# Patient Record
Sex: Male | Born: 1985
Health system: Southern US, Community
[De-identification: ages and names within clinical notes are randomized; demographics above are authoritative.]

## PROBLEM LIST (undated history)

## (undated) DIAGNOSIS — G809 Cerebral palsy, unspecified: Secondary | ICD-10-CM

## (undated) DIAGNOSIS — R569 Unspecified convulsions: Secondary | ICD-10-CM

## (undated) HISTORY — PX: PATENT DUCTUS ARTERIOUS REPAIR: SHX269

## (undated) HISTORY — PX: EYE SURGERY: SHX253

## (undated) HISTORY — PX: OTHER SURGICAL HISTORY: SHX169

---

## 2011-11-06 ENCOUNTER — Encounter (HOSPITAL_BASED_OUTPATIENT_CLINIC_OR_DEPARTMENT_OTHER): Payer: Self-pay | Admitting: *Deleted

## 2011-11-06 ENCOUNTER — Emergency Department (HOSPITAL_BASED_OUTPATIENT_CLINIC_OR_DEPARTMENT_OTHER)
Admission: EM | Admit: 2011-11-06 | Discharge: 2011-11-06 | Disposition: A | Discharge status: Home / Self Care | Payer: Medicaid Other | Attending: Emergency Medicine | Admitting: Emergency Medicine

## 2011-11-06 DIAGNOSIS — G809 Cerebral palsy, unspecified: Secondary | ICD-10-CM | POA: Insufficient documentation

## 2011-11-06 DIAGNOSIS — J029 Acute pharyngitis, unspecified: Secondary | ICD-10-CM | POA: Insufficient documentation

## 2011-11-06 DIAGNOSIS — G40909 Epilepsy, unspecified, not intractable, without status epilepticus: Secondary | ICD-10-CM | POA: Insufficient documentation

## 2011-11-06 HISTORY — DX: Unspecified convulsions: R56.9

## 2011-11-06 HISTORY — DX: Cerebral palsy, unspecified: G80.9

## 2011-11-06 LAB — RAPID STREP SCREEN (MED CTR MEBANE ONLY): Streptococcus, Group A Screen (Direct): NEGATIVE

## 2011-11-06 MED ORDER — LIDOCAINE VISCOUS 2 % MT SOLN: 15.0000 mL | OROMUCOSAL | Status: AC | PRN

## 2011-11-06 NOTE — ED Provider Notes (Signed)
History     CSN: [id number]  Arrival date & time 11/06/11  1721   First MD Initiated Contact with Patient 11/06/11 1851      Chief Complaint  Patient presents with  . Sore Throat    (Consider location/radiation/quality/duration/timing/severity/associated sxs/prior treatment) HPI  27 year old man with cerebral palsy accompanied by mother complaining of sore throat worsening over the course of the week. Patient denies any cough reports subjective fever, denies any nausea vomiting abdominal pain. Patient has reduced by mouth intake secondary to pain. He has tried no pain medications.  Past Medical History  Diagnosis Date  . Seizures   . CP (cerebral palsy)     Past Surgical History  Procedure Date  . Patent ductus arterious repair   . Cerebral shunt   . Eye surgery     No family history on file.  History  Substance Use Topics  . Smoking status: Not on file  . Smokeless tobacco: Not on file  . Alcohol Use:       Review of Systems  Constitutional: Positive for fever.  HENT: Positive for sore throat.   Respiratory: Negative for shortness of breath.   Cardiovascular: Negative for chest pain.  Gastrointestinal: Negative for nausea, vomiting, abdominal pain and diarrhea.  All other systems reviewed and are negative.    Allergies  Review of patient's allergies indicates no known allergies.  Home Medications   Current Outpatient Rx  Name Route Sig Dispense Refill  . DIVALPROEX SODIUM 500 MG PO TBEC Oral Take 500 mg by mouth 3 (three) times daily.    Marland Kitchen LIDOCAINE VISCOUS 2 % MT SOLN Oral Take 15 mLs by mouth every 3 (three) hours as needed for pain. 100 mL 0    BP 115/80  Pulse 59  Temp 98.3 F (36.8 C) (Oral)  Resp 22  SpO2 100%  Physical Exam  Nursing note and vitals reviewed. Constitutional: He is oriented to person, place, and time. He appears well-developed and well-nourished. No distress.  HENT:  Head: Normocephalic and atraumatic.  Right Ear:  External ear normal.  Left Ear: External ear normal.  Nose: Nose normal.  Mouth/Throat: Oropharynx is clear and moist. No oropharyngeal exudate.       No tonsillar hypertrophy. Mild posterior pharynx erythema.  Eyes: Conjunctivae normal and EOM are normal. Pupils are equal, round, and reactive to light.  Neck: Normal range of motion.  Cardiovascular: Normal rate, regular rhythm and normal heart sounds.   Pulmonary/Chest: Effort normal and breath sounds normal. No stridor. No respiratory distress. He has no wheezes. He has no rales. He exhibits no tenderness.  Abdominal: Soft. Bowel sounds are normal.  Musculoskeletal: Normal range of motion.  Neurological: He is alert and oriented to person, place, and time.  Skin: Skin is warm.  Psychiatric: He has a normal mood and affect.    ED Course  Procedures (including critical care time)   Labs Reviewed  RAPID STREP SCREEN  STREP A DNA PROBE   No results found.   1. Pharyngitis       MDM  26 year old male with sore throat worsening over the course of 4 days. He does not meet center criteria for treatment. I will send off a strep DNA probe and treat symptomatically with lidocaine.  New Prescriptions   LIDOCAINE (XYLOCAINE) 2 % SOLUTION    Take 15 mLs by mouth every 3 (three) hours as needed for pain.           Wynetta Emery, PA-C 11/06/11  1914 

## 2011-11-06 NOTE — ED Notes (Signed)
Sore throat x 3 days

## 2011-11-07 ENCOUNTER — Emergency Department (HOSPITAL_BASED_OUTPATIENT_CLINIC_OR_DEPARTMENT_OTHER)
Admission: EM | Admit: 2011-11-07 | Discharge: 2011-11-07 | Disposition: A | Discharge status: Home / Self Care | Payer: Medicaid Other | Attending: Emergency Medicine | Admitting: Emergency Medicine

## 2011-11-07 ENCOUNTER — Encounter (HOSPITAL_BASED_OUTPATIENT_CLINIC_OR_DEPARTMENT_OTHER): Payer: Self-pay | Admitting: *Deleted

## 2011-11-07 DIAGNOSIS — G40909 Epilepsy, unspecified, not intractable, without status epilepticus: Secondary | ICD-10-CM | POA: Insufficient documentation

## 2011-11-07 DIAGNOSIS — K0889 Other specified disorders of teeth and supporting structures: Secondary | ICD-10-CM

## 2011-11-07 DIAGNOSIS — G809 Cerebral palsy, unspecified: Secondary | ICD-10-CM | POA: Insufficient documentation

## 2011-11-07 DIAGNOSIS — K089 Disorder of teeth and supporting structures, unspecified: Secondary | ICD-10-CM | POA: Insufficient documentation

## 2011-11-07 LAB — STREP A DNA PROBE: Group A Strep Probe: NEGATIVE

## 2011-11-07 MED ORDER — PENICILLIN V POTASSIUM 500 MG PO TABS: 500.0000 mg | ORAL_TABLET | Freq: Three times a day (TID) | ORAL | Status: DC

## 2011-11-07 MED ORDER — NAPROXEN 500 MG PO TABS: 500.0000 mg | ORAL_TABLET | Freq: Two times a day (BID) | ORAL | Status: AC

## 2011-11-07 NOTE — ED Provider Notes (Signed)
Medical screening examination/treatment/procedure(s) were performed by non-physician practitioner and as supervising physician I was immediately available for consultation/collaboration.  Ah Bott M Sumedha Munnerlyn, MD 11/07/11 1556 

## 2011-11-07 NOTE — ED Notes (Signed)
Right facial pain. Mom thinks he has a tooth abscess. Was here yesterday for sore throat.

## 2011-11-07 NOTE — ED Provider Notes (Signed)
History     CSN: [id number] Arrival date & time 11/07/11  1750 First MD Initiated Contact with Patient 11/07/11 1815      Chief Complaint  Patient presents with  . Dental Pain    HPI The patient presents to the emergency room with complaints of dental pain. Patient has history of cerebral palsy. Mom thought he had been having trouble with a sore throat. He was seen in the emergency department yesterday and had negative strep test.  Mom states that today his symptoms persisted and he seemed to be complaining of pain more around his tooth area on the right lower side. He has not seen a dentist in a while. She brought him to the emergency room for evaluation. He has not had any trouble with fever, vomiting or diarrhea.  No difficulty swallowing Past Medical History  Diagnosis Date  . Seizures   . CP (cerebral palsy)     Past Surgical History  Procedure Date  . Patent ductus arterious repair   . Cerebral shunt   . Eye surgery     No family history on file.  History  Substance Use Topics  . Smoking status: Never Smoker   . Smokeless tobacco: Not on file  . Alcohol Use: No      Review of Systems  All other systems reviewed and are negative.    Allergies  Review of patient's allergies indicates no known allergies.  Home Medications   Current Outpatient Rx  Name Route Sig Dispense Refill  . DIVALPROEX SODIUM 500 MG PO TBEC Oral Take 500 mg by mouth 3 (three) times daily.    Marland Kitchen LIDOCAINE VISCOUS 2 % MT SOLN Oral Take 15 mLs by mouth every 3 (three) hours as needed for pain. 100 mL 0  . NAPROXEN 500 MG PO TABS Oral Take 1 tablet (500 mg total) by mouth 2 (two) times daily. 30 tablet 0  . PENICILLIN V POTASSIUM 500 MG PO TABS Oral Take 1 tablet (500 mg total) by mouth 3 (three) times daily. 30 tablet 0    BP 119/84  Pulse 54  Temp 98 F (36.7 C) (Oral)  Resp 20  SpO2 100%  Physical Exam  Nursing note and vitals reviewed. Constitutional: He appears well-developed  and well-nourished. No distress.  HENT:  Head: Normocephalic and atraumatic.  Right Ear: External ear normal.  Left Ear: External ear normal.  Mouth/Throat: No oropharyngeal exudate.       No obvious dental abnormalities or swelling,  excessive plaque buildup or irritation  Eyes: Conjunctivae normal are normal. Right eye exhibits no discharge. Left eye exhibits no discharge. No scleral icterus.  Neck: Neck supple. No tracheal deviation present.  Cardiovascular: Normal rate, regular rhythm and intact distal pulses.   Pulmonary/Chest: Effort normal and breath sounds normal. No stridor. No respiratory distress. He has no wheezes. He exhibits no tenderness.  Abdominal: He exhibits no distension.  Musculoskeletal: He exhibits no edema and no tenderness.  Neurological: He is alert. He has normal strength. No sensory deficit. Cranial nerve deficit:  no gross defecits noted. He displays no seizure activity.  Skin: Skin is warm and dry. No rash noted.  Psychiatric: He has a normal mood and affect.    ED Course  Procedures (including critical care time)  Labs Reviewed - No data to display No results found.   1. Pain, dental       MDM  Is possible the patient is developing a dental infection. No obvious abscess noted.  I will start him empirically on penicillin. Encourage followup with a dentist for further evaluation       Celene Kras, MD 11/07/11 [phone]

## 2013-01-27 ENCOUNTER — Encounter (HOSPITAL_BASED_OUTPATIENT_CLINIC_OR_DEPARTMENT_OTHER): Payer: Self-pay | Admitting: Emergency Medicine

## 2013-01-27 ENCOUNTER — Emergency Department (HOSPITAL_BASED_OUTPATIENT_CLINIC_OR_DEPARTMENT_OTHER)
Admission: EM | Admit: 2013-01-27 | Discharge: 2013-01-27 | Disposition: A | Discharge status: Home / Self Care | Payer: Medicaid Other | Attending: Emergency Medicine | Admitting: Emergency Medicine

## 2013-01-27 DIAGNOSIS — J329 Chronic sinusitis, unspecified: Secondary | ICD-10-CM

## 2013-01-27 DIAGNOSIS — IMO0002 Reserved for concepts with insufficient information to code with codable children: Secondary | ICD-10-CM | POA: Insufficient documentation

## 2013-01-27 DIAGNOSIS — Z792 Long term (current) use of antibiotics: Secondary | ICD-10-CM | POA: Insufficient documentation

## 2013-01-27 DIAGNOSIS — J069 Acute upper respiratory infection, unspecified: Secondary | ICD-10-CM | POA: Insufficient documentation

## 2013-01-27 DIAGNOSIS — Z791 Long term (current) use of non-steroidal anti-inflammatories (NSAID): Secondary | ICD-10-CM | POA: Insufficient documentation

## 2013-01-27 DIAGNOSIS — J029 Acute pharyngitis, unspecified: Secondary | ICD-10-CM | POA: Insufficient documentation

## 2013-01-27 DIAGNOSIS — Z9981 Dependence on supplemental oxygen: Secondary | ICD-10-CM | POA: Insufficient documentation

## 2013-01-27 DIAGNOSIS — Z79899 Other long term (current) drug therapy: Secondary | ICD-10-CM | POA: Insufficient documentation

## 2013-01-27 DIAGNOSIS — G40909 Epilepsy, unspecified, not intractable, without status epilepticus: Secondary | ICD-10-CM | POA: Insufficient documentation

## 2013-01-27 MED ORDER — FLUTICASONE PROPIONATE 50 MCG/ACT NA SUSP: 2.0000 | Freq: Every day | NASAL | Status: AC

## 2013-01-27 MED ORDER — IBUPROFEN 800 MG PO TABS: 800.0000 mg | ORAL_TABLET | Freq: Three times a day (TID) | ORAL | Status: AC

## 2013-01-27 MED ORDER — IBUPROFEN 800 MG PO TABS
800.0000 mg | ORAL_TABLET | Freq: Once | ORAL | Status: AC
  Administered 2013-01-27: 800 mg via ORAL
  Filled 2013-01-27: qty 1

## 2013-01-27 NOTE — ED Provider Notes (Signed)
CSN: [id number]     Arrival date & time 01/27/13  1758 History   First MD Initiated Contact with Patient 01/27/13 1800     Chief Complaint  Patient presents with  . Headache   (Consider location/radiation/quality/duration/timing/severity/associated sxs/prior Treatment) HPI Comments: Patient is a 27 year old male with a past medical history of seizures and cerebral palsy who presents to the emergency department with his mother complaining of sore throat, runny nose, congestion and headache x2 days. Denies fever, chills, abdominal pain, nausea, vomiting, cough, chest pain, shortness of breath or wheezing. No aggravating or alleviating factors. He has not tried any over-the-counter medications for his symptoms. No sick contacts.  Patient is a 27 y.o. male presenting with headaches. The history is provided by the patient and a relative.  Headache Associated symptoms: congestion and sore throat     Past Medical History  Diagnosis Date  . Seizures   . CP (cerebral palsy)    Past Surgical History  Procedure Laterality Date  . Patent ductus arterious repair    . Cerebral shunt    . Eye surgery     No family history on file. History  Substance Use Topics  . Smoking status: Never Smoker   . Smokeless tobacco: Not on file  . Alcohol Use: No    Review of Systems  HENT: Positive for congestion, rhinorrhea and sore throat.   Neurological: Positive for headaches.  All other systems reviewed and are negative.    Allergies  Review of patient's allergies indicates no known allergies.  Home Medications   Current Outpatient Rx  Name  Route  Sig  Dispense  Refill  . divalproex (DEPAKOTE) 500 MG DR tablet   Oral   Take 500 mg by mouth 3 (three) times daily.         . fluticasone (FLONASE) 50 MCG/ACT nasal spray   Each Nare   Place 2 sprays into both nostrils daily.   16 g   2   . ibuprofen (ADVIL,MOTRIN) 800 MG tablet   Oral   Take 1 tablet (800 mg total) by mouth 3 (three)  times daily.   21 tablet   0   . lidocaine (XYLOCAINE) 2 % solution   Oral   Take 15 mLs by mouth every 3 (three) hours as needed for pain.   100 mL   0   . naproxen (NAPROSYN) 500 MG tablet   Oral   Take 1 tablet (500 mg total) by mouth 2 (two) times daily.   30 tablet   0   . penicillin v potassium (VEETID) 500 MG tablet   Oral   Take 1 tablet (500 mg total) by mouth 3 (three) times daily.   30 tablet   0    BP 118/76  Pulse 66  Temp(Src) 97.6 F (36.4 C) (Oral)  Resp 20  Ht 4\' 11"  (1.499 m)  Wt 123 lb (55.792 kg)  BMI 24.83 kg/m2  SpO2 100% Physical Exam  Nursing note and vitals reviewed. Constitutional: He is oriented to person, place, and time. He appears well-developed and well-nourished. No distress.  HENT:  Head: Normocephalic and atraumatic.  Nose: Mucosal edema present. Right sinus exhibits frontal sinus tenderness. Left sinus exhibits frontal sinus tenderness.  Mouth/Throat: Uvula is midline, oropharynx is clear and moist and mucous membranes are normal.  Nasal congestion and post nasal drip.  Eyes: Conjunctivae are normal.  Neck: Normal range of motion. Neck supple.  Cardiovascular: Normal rate, regular rhythm and normal heart sounds.  Pulmonary/Chest: Effort normal and breath sounds normal.  Musculoskeletal: Normal range of motion. He exhibits no edema.  Neurological: He is alert and oriented to person, place, and time.  Skin: Skin is warm and dry. He is not diaphoretic.  Psychiatric: He has a normal mood and affect. His behavior is normal.    ED Course  Procedures (including critical care time) Labs Review Labs Reviewed - No data to display Imaging Review No results found.  EKG Interpretation   None       MDM   1. URI (upper respiratory infection)   2. Sinusitis    Well appearing, NAD, normal VS. Symptomatic tx discussed. Flonase, ibuprofen, mucinex, nasal saline. Return precautions given. Patient and mom state understanding of  treatment care plan and are agreeable.    Trevor Mace, PA-C 01/27/13 1821

## 2013-01-27 NOTE — ED Notes (Signed)
Headache, sore throat and runny nose x 2 days.

## 2013-01-27 NOTE — ED Provider Notes (Signed)
Medical screening examination/treatment/procedure(s) were performed by non-physician practitioner and as supervising physician I was immediately available for consultation/collaboration.  EKG Interpretation   None        Juliet Rude. Rubin Payor, MD 01/27/13 2255

## 2013-01-31 ENCOUNTER — Encounter: Payer: Self-pay | Admitting: Family Medicine

## 2013-01-31 ENCOUNTER — Ambulatory Visit (INDEPENDENT_AMBULATORY_CARE_PROVIDER_SITE_OTHER): Payer: Self-pay | Admitting: Family Medicine

## 2013-01-31 VITALS — BP 122/77 | HR 60 | Ht 59.0 in

## 2013-01-31 DIAGNOSIS — S8991XA Unspecified injury of right lower leg, initial encounter: Secondary | ICD-10-CM

## 2013-01-31 DIAGNOSIS — S8990XA Unspecified injury of unspecified lower leg, initial encounter: Secondary | ICD-10-CM

## 2013-01-31 NOTE — Patient Instructions (Signed)
You have a knee contusion. We will obtain records from [facility] (x-ray reports, physician report) as well and call you if anything looked abnormal. Ibuprofen or aleve as needed for pain. Icing 15 minutes at a time 3-4 times a day. Knee brace for support. If still struggling about 2 weeks from now call me and we can put you in physical therapy. Activities as tolerated. Follow up with me in 6 weeks for reevaluation.

## 2013-02-05 ENCOUNTER — Encounter: Payer: Self-pay | Admitting: Family Medicine

## 2013-02-05 DIAGNOSIS — S8991XA Unspecified injury of right lower leg, initial encounter: Secondary | ICD-10-CM | POA: Insufficient documentation
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
## 2013-02-05 NOTE — Assessment & Plan Note (Signed)
radiographs reportedly normal but will obtain reports from [facility].  Reassured, consistent with contusion.  Icing, knee brace, nsaids.  Consider formal PT if not improving.  F/u in 6 weeks for reevaluation.

## 2013-03-14 ENCOUNTER — Ambulatory Visit: Payer: Medicaid Other | Admitting: Family Medicine

## 2013-03-18 ENCOUNTER — Emergency Department (HOSPITAL_BASED_OUTPATIENT_CLINIC_OR_DEPARTMENT_OTHER): Payer: Medicaid Other

## 2013-03-18 ENCOUNTER — Emergency Department (HOSPITAL_BASED_OUTPATIENT_CLINIC_OR_DEPARTMENT_OTHER)
Admission: EM | Admit: 2013-03-18 | Discharge: 2013-03-18 | Disposition: A | Discharge status: Home / Self Care | Payer: Medicaid Other | Attending: Emergency Medicine | Admitting: Emergency Medicine

## 2013-03-18 ENCOUNTER — Encounter (HOSPITAL_BASED_OUTPATIENT_CLINIC_OR_DEPARTMENT_OTHER): Payer: Self-pay | Admitting: Emergency Medicine

## 2013-03-18 DIAGNOSIS — G40909 Epilepsy, unspecified, not intractable, without status epilepticus: Secondary | ICD-10-CM | POA: Insufficient documentation

## 2013-03-18 DIAGNOSIS — J111 Influenza due to unidentified influenza virus with other respiratory manifestations: Secondary | ICD-10-CM | POA: Insufficient documentation

## 2013-03-18 DIAGNOSIS — Z791 Long term (current) use of non-steroidal anti-inflammatories (NSAID): Secondary | ICD-10-CM | POA: Insufficient documentation

## 2013-03-18 DIAGNOSIS — Z79899 Other long term (current) drug therapy: Secondary | ICD-10-CM | POA: Insufficient documentation

## 2013-03-18 DIAGNOSIS — Z982 Presence of cerebrospinal fluid drainage device: Secondary | ICD-10-CM | POA: Insufficient documentation

## 2013-03-18 DIAGNOSIS — R69 Illness, unspecified: Secondary | ICD-10-CM

## 2013-03-18 LAB — CBC WITH DIFFERENTIAL/PLATELET
BAND NEUTROPHILS: 0 % (ref 0–10)
Basophils Absolute: 0 10*3/uL (ref 0.0–0.1)
Basophils Relative: 1 % (ref 0–1)
Blasts: 0 %
EOS ABS: 0 10*3/uL (ref 0.0–0.7)
EOS PCT: 1 % (ref 0–5)
HEMATOCRIT: 43.5 % (ref 39.0–52.0)
HEMOGLOBIN: 14.7 g/dL (ref 13.0–17.0)
LYMPHS ABS: 2.8 10*3/uL (ref 0.7–4.0)
Lymphocytes Relative: 63 % — ABNORMAL HIGH (ref 12–46)
MCH: 27.6 pg (ref 26.0–34.0)
MCHC: 33.8 g/dL (ref 30.0–36.0)
MCV: 81.6 fL (ref 78.0–100.0)
METAMYELOCYTES PCT: 0 %
MONO ABS: 0.5 10*3/uL (ref 0.1–1.0)
MONOS PCT: 11 % (ref 3–12)
Myelocytes: 0 %
NRBC: 0 /100{WBCs}
Neutro Abs: 1 10*3/uL — ABNORMAL LOW (ref 1.7–7.7)
Neutrophils Relative %: 24 % — ABNORMAL LOW (ref 43–77)
Platelets: DECREASED 10*3/uL (ref 150–400)
Promyelocytes Absolute: 0 %
RBC: 5.33 MIL/uL (ref 4.22–5.81)
RDW: 14.4 % (ref 11.5–15.5)
WBC: 4.3 10*3/uL (ref 4.0–10.5)

## 2013-03-18 LAB — BASIC METABOLIC PANEL
BUN: 17 mg/dL (ref 6–23)
CALCIUM: 9.4 mg/dL (ref 8.4–10.5)
CO2: 30 mEq/L (ref 19–32)
CREATININE: 1 mg/dL (ref 0.50–1.35)
Chloride: 99 mEq/L (ref 96–112)
GFR calc Af Amer: >90 mL/min (ref >90)
GLUCOSE: 136 mg/dL — AB (ref 70–99)
Potassium: 4.2 mEq/L (ref 3.7–5.3)
SODIUM: 141 meq/L (ref 137–147)

## 2013-03-18 LAB — CG4 I-STAT (LACTIC ACID): Lactic Acid, Venous: 1.75 mmol/L (ref 0.5–2.2)

## 2013-03-18 MED ORDER — IBUPROFEN 600 MG PO TABS: 600.0000 mg | ORAL_TABLET | Freq: Four times a day (QID) | ORAL | Status: AC | PRN

## 2013-03-18 MED ORDER — ACETAMINOPHEN 325 MG PO TABS
650.0000 mg | ORAL_TABLET | Freq: Once | ORAL | Status: AC
  Administered 2013-03-18: 650 mg via ORAL
  Filled 2013-03-18: qty 2

## 2013-03-18 MED ORDER — SODIUM CHLORIDE 0.9 % IV BOLUS (SEPSIS): 500.0000 mL | Freq: Once | INTRAVENOUS | Status: DC

## 2013-03-18 MED ORDER — OSELTAMIVIR PHOSPHATE 75 MG PO CAPS: 75.0000 mg | ORAL_CAPSULE | Freq: Two times a day (BID) | ORAL | Status: DC

## 2013-03-18 MED ORDER — OSELTAMIVIR PHOSPHATE 75 MG PO CAPS
75.0000 mg | ORAL_CAPSULE | Freq: Once | ORAL | Status: AC
  Administered 2013-03-18: 75 mg via ORAL
  Filled 2013-03-18: qty 1

## 2013-03-18 NOTE — ED Notes (Signed)
Attempted IV access x 1 in right forearm unsuccessful.

## 2013-03-18 NOTE — Discharge Instructions (Signed)

## 2013-03-18 NOTE — ED Provider Notes (Signed)
CSN: [id number]     Arrival date & time 03/18/13  1022 History   First MD Initiated Contact with Patient 03/18/13 1109     Chief Complaint  Patient presents with  . Fever  . Headache  . Nasal Congestion  . Generalized Body Aches   (Consider location/radiation/quality/duration/timing/severity/associated sxs/prior Treatment) HPI 28 year old male with cerebral palsy who presents with his mother giving history. She states that he has complained of fever headache, nasal congestion, and cough for the past 2 days. He has not been eating as well as usual but has been taking food without difficulty. His complained of some headache to her. He has been taking his medicines as usual. He had a seizure that lasted for a for short time last night. His mother states he has been taking his medications including his Depakote as usual. She describes the seizure is very short in nature and states that he has seizures about every 2 months. Past Medical History  Diagnosis Date  . Seizures   . CP (cerebral palsy)    Past Surgical History  Procedure Laterality Date  . Patent ductus arterious repair    . Cerebral shunt    . Eye surgery     Family History  Problem Relation Age of Onset  . Sudden death Neg Hx   . Hypertension Neg Hx   . Hyperlipidemia Neg Hx   . Heart attack Neg Hx   . Diabetes Neg Hx    History  Substance Use Topics  . Smoking status: Never Smoker   . Smokeless tobacco: Not on file  . Alcohol Use: No    Review of Systems  All other systems reviewed and are negative.    Allergies  Review of patient's allergies indicates no known allergies.  Home Medications   Current Outpatient Rx  Name  Route  Sig  Dispense  Refill  . divalproex (DEPAKOTE) 500 MG DR tablet   Oral   Take 500 mg by mouth 3 (three) times daily.         . fluticasone (FLONASE) 50 MCG/ACT nasal spray   Each Nare   Place 2 sprays into both nostrils daily.   16 g   2   . ibuprofen (ADVIL,MOTRIN) 800 MG  tablet   Oral   Take 1 tablet (800 mg total) by mouth 3 (three) times daily.   21 tablet   0   . lidocaine (XYLOCAINE) 2 % solution   Oral   Take 15 mLs by mouth every 3 (three) hours as needed for pain.   100 mL   0   . naproxen (NAPROSYN) 500 MG tablet   Oral   Take 1 tablet (500 mg total) by mouth 2 (two) times daily.   30 tablet   0   . penicillin v potassium (VEETID) 500 MG tablet   Oral   Take 1 tablet (500 mg total) by mouth 3 (three) times daily.   30 tablet   0    BP 114/69  Pulse 71  Temp(Src) 97.7 F (36.5 C) (Rectal)  Resp 16  Ht 4\' 11"  (1.499 m)  Wt 113 lb (51.256 kg)  BMI 22.81 kg/m2  SpO2 100% Physical Exam  Nursing note and vitals reviewed. Constitutional: He appears well-developed and well-nourished.  HENT:  Head: Normocephalic and atraumatic.  Right Ear: External ear normal.  Left Ear: External ear normal.  Nose: Nose normal.  Mouth/Throat: Oropharynx is clear and moist.  Eyes: Conjunctivae and EOM are normal. Pupils are  equal, round, and reactive to light.  Neck: Normal range of motion. Neck supple.  Cardiovascular: Normal rate, regular rhythm, normal heart sounds and intact distal pulses.   Pulmonary/Chest: Effort normal and breath sounds normal.  Abdominal: Soft. Bowel sounds are normal.  Musculoskeletal: Normal range of motion.  Neurological: He is alert. He has normal reflexes.  Patient only answers questions in one-word answers. He follows directions and is smiling on my exam.  Skin: Skin is warm and dry.  Psychiatric: He has a normal mood and affect. His behavior is normal.    ED Course  Procedures (including critical care time) Labs Review Labs Reviewed  CBC WITH DIFFERENTIAL - Abnormal; Notable for the following:    Neutrophils Relative % 24 (*)    Lymphocytes Relative 63 (*)    Neutro Abs 1.0 (*)    All other components within normal limits  BASIC METABOLIC PANEL - Abnormal; Notable for the following:    Glucose, Bld 136 (*)     All other components within normal limits  CG4 I-STAT (LACTIC ACID)   Imaging Review Dg Chest 2 View  03/18/2013   CLINICAL DATA:  Cough with congestion and fever. History of cerebral palsy.  EXAM: CHEST  2 VIEW  COMPARISON:  XR-CHEST 2 VIEWS dated 02/23/2012; XR-CHEST 1 VIEW dated 06/02/2004  FINDINGS: Left IJ central venous catheter appears unchanged with the tip overlying the left brachiocephalic vein. The heart size and mediastinal contours are stable. The lungs are clear. There is no pleural effusion or pneumothorax. Surgical clips are noted within the AP window. Partial fusion of the left fourth and fifth ribs is noted.  IMPRESSION: Stable chest.  No acute cardiopulmonary process.   Electronically Signed   By: Roxy Horseman M.D.   On: 03/18/2013 12:52   Ct Head Wo Contrast  03/18/2013   CLINICAL DATA:  Fever, headache, body aches  EXAM: CT HEAD WITHOUT CONTRAST  TECHNIQUE: Contiguous axial images were obtained from the base of the skull through the vertex without intravenous contrast.  COMPARISON:  01/28/2013  FINDINGS: Stable chronic enlargement of the lateral ventricles. Absence of the corpus callosum. Stable prominence of the third and fourth ventricles. VP shunt tube in unchanged position. No evidence of hemorrhage or extra-axial fluid. No mass or infarct. Calvarium is intact.  IMPRESSION: No acute intracranial findings. Stable developmental and associated therapeutic findings.   Electronically Signed   By: Esperanza Heir M.D.   On: 03/18/2013 12:49    EKG Interpretation   None       MDM  No diagnosis found. Patient with influenza-like illness presents here without any acute laboratory abnormalities. His blood sugar is elevated at 136. Has a history of a VP shunt and had CTs and shows no acute intracranial findings. His neck is supple and he has a normal neurologic baseline per his mother. Given the patient's comorbidities of cerebral palsy and VP shunt he will be placed on  Tamiflu.   Hilario Quarry, MD 03/18/13 [phone]

## 2013-03-18 NOTE — ED Notes (Signed)
Fever, headaches, body aches, and congestion since Saturday.

## 2014-04-03 ENCOUNTER — Encounter (HOSPITAL_BASED_OUTPATIENT_CLINIC_OR_DEPARTMENT_OTHER): Payer: Self-pay

## 2014-04-03 ENCOUNTER — Emergency Department (HOSPITAL_BASED_OUTPATIENT_CLINIC_OR_DEPARTMENT_OTHER)
Admission: EM | Admit: 2014-04-03 | Discharge: 2014-04-03 | Disposition: A | Discharge status: Home / Self Care | Payer: Medicaid Other | Attending: Emergency Medicine | Admitting: Emergency Medicine

## 2014-04-03 ENCOUNTER — Emergency Department (HOSPITAL_BASED_OUTPATIENT_CLINIC_OR_DEPARTMENT_OTHER): Payer: Medicaid Other

## 2014-04-03 DIAGNOSIS — Z7951 Long term (current) use of inhaled steroids: Secondary | ICD-10-CM | POA: Insufficient documentation

## 2014-04-03 DIAGNOSIS — Z791 Long term (current) use of non-steroidal anti-inflammatories (NSAID): Secondary | ICD-10-CM | POA: Diagnosis not present

## 2014-04-03 DIAGNOSIS — Z79899 Other long term (current) drug therapy: Secondary | ICD-10-CM | POA: Diagnosis not present

## 2014-04-03 DIAGNOSIS — J159 Unspecified bacterial pneumonia: Secondary | ICD-10-CM | POA: Diagnosis not present

## 2014-04-03 DIAGNOSIS — G40909 Epilepsy, unspecified, not intractable, without status epilepticus: Secondary | ICD-10-CM | POA: Diagnosis not present

## 2014-04-03 DIAGNOSIS — J189 Pneumonia, unspecified organism: Secondary | ICD-10-CM

## 2014-04-03 DIAGNOSIS — Z792 Long term (current) use of antibiotics: Secondary | ICD-10-CM | POA: Insufficient documentation

## 2014-04-03 DIAGNOSIS — R05 Cough: Secondary | ICD-10-CM | POA: Diagnosis present

## 2014-04-03 DIAGNOSIS — R509 Fever, unspecified: Secondary | ICD-10-CM

## 2014-04-03 LAB — RAPID STREP SCREEN (MED CTR MEBANE ONLY): STREPTOCOCCUS, GROUP A SCREEN (DIRECT): NEGATIVE

## 2014-04-03 MED ORDER — LEVOFLOXACIN 750 MG PO TABS: 750.0000 mg | ORAL_TABLET | Freq: Every day | ORAL | Status: DC

## 2014-04-03 NOTE — Discharge Instructions (Signed)

## 2014-04-03 NOTE — ED Notes (Signed)
Pt is unable to void.  PO fluids provided.

## 2014-04-03 NOTE — ED Notes (Signed)
Patient transported to X-ray 

## 2014-04-03 NOTE — ED Notes (Signed)
Fever, congestion, cough, diarrhea and back pain x 3 days.  Mother reports he isn't acting like he feels well.

## 2014-04-03 NOTE — ED Provider Notes (Signed)
CSN: [id number]     Arrival date & time 04/03/14  0707 History   First MD Initiated Contact with Patient 04/03/14 [phone]     Chief Complaint  Patient presents with  . Cough     (Consider location/radiation/quality/duration/timing/severity/associated sxs/prior Treatment) HPI Comments: For evaluation of fever. Patient has been sick for a couple of days. He has a lot of nasal congestion, but has had a slight cough. He has been complaining of a sore throat and aching pain in the muscles of his back. He has not had nausea or vomiting, there has been slight diarrhea.  Patient is a 29 y.o. male presenting with cough.  Cough Associated symptoms: myalgias     Past Medical History  Diagnosis Date  . Seizures   . CP (cerebral palsy)    Past Surgical History  Procedure Laterality Date  . Patent ductus arterious repair    . Cerebral shunt    . Eye surgery     Family History  Problem Relation Age of Onset  . Sudden death Neg Hx   . Hypertension Neg Hx   . Hyperlipidemia Neg Hx   . Heart attack Neg Hx   . Diabetes Neg Hx    History  Substance Use Topics  . Smoking status: Never Smoker   . Smokeless tobacco: Not on file  . Alcohol Use: No    Review of Systems  HENT: Positive for congestion.   Respiratory: Positive for cough.   Gastrointestinal: Positive for diarrhea.  Musculoskeletal: Positive for myalgias.  All other systems reviewed and are negative.     Allergies  Review of patient's allergies indicates no known allergies.  Home Medications   Prior to Admission medications   Medication Sig Start Date End Date Taking? Authorizing Provider  divalproex (DEPAKOTE) 500 MG DR tablet Take 500 mg by mouth 3 (three) times daily.    Historical Provider, MD  fluticasone (FLONASE) 50 MCG/ACT nasal spray Place 2 sprays into both nostrils daily. 01/27/13   Robyn M Hess, PA-C  ibuprofen (ADVIL,MOTRIN) 600 MG tablet Take 1 tablet (600 mg total) by mouth every 6 (six) hours as needed.  03/18/13   Hilario Quarryanielle S Ray, MD  ibuprofen (ADVIL,MOTRIN) 800 MG tablet Take 1 tablet (800 mg total) by mouth 3 (three) times daily. 01/27/13   Robyn M Hess, PA-C  lidocaine (XYLOCAINE) 2 % solution Take 15 mLs by mouth every 3 (three) hours as needed for pain. 11/06/11   Nicole Pisciotta, PA-C  naproxen (NAPROSYN) 500 MG tablet Take 1 tablet (500 mg total) by mouth 2 (two) times daily. 11/07/11   Linwood DibblesJon Knapp, MD  oseltamivir (TAMIFLU) 75 MG capsule Take 1 capsule (75 mg total) by mouth every 12 (twelve) hours. 03/18/13   Hilario Quarryanielle S Ray, MD  penicillin v potassium (VEETID) 500 MG tablet Take 1 tablet (500 mg total) by mouth 3 (three) times daily. 11/07/11   Linwood DibblesJon Knapp, MD   BP 115/64 mmHg  Pulse 115  Temp(Src) 99.5 F (37.5 C) (Oral)  Resp 18  Ht 4\' 11"  (1.499 m)  Wt 109 lb (49.442 kg)  BMI 22.00 kg/m2  SpO2 97% Physical Exam  Constitutional: He is oriented to person, place, and time. He appears well-developed and well-nourished. No distress.  HENT:  Head: Normocephalic and atraumatic.  Right Ear: Hearing normal.  Left Ear: Hearing normal.  Nose: Nose normal.  Mouth/Throat: Oropharynx is clear and moist and mucous membranes are normal.  Eyes: Conjunctivae and EOM are normal. Pupils are equal, round,  and reactive to light.  Neck: Normal range of motion. Neck supple.  Cardiovascular: Regular rhythm, S1 normal and S2 normal.  Exam reveals no gallop and no friction rub.   No murmur heard. Pulmonary/Chest: Effort normal and breath sounds normal. No respiratory distress. He exhibits no tenderness.  Abdominal: Soft. Normal appearance and bowel sounds are normal. There is no hepatosplenomegaly. There is no tenderness. There is no rebound, no guarding, no tenderness at McBurney's point and negative Murphy's sign. No hernia.  Musculoskeletal: Normal range of motion.  Neurological: He is alert and oriented to person, place, and time. He has normal strength. No cranial nerve deficit or sensory deficit.  Coordination normal. GCS eye subscore is 4. GCS verbal subscore is 5. GCS motor subscore is 6.  Skin: Skin is warm, dry and intact. No rash noted. No cyanosis.  Psychiatric: He has a normal mood and affect. His speech is normal and behavior is normal. Thought content normal.  Nursing note and vitals reviewed.   ED Course  Procedures (including critical care time) Labs Review Labs Reviewed  RAPID STREP SCREEN  CULTURE, GROUP A STREP  URINALYSIS, ROUTINE W REFLEX MICROSCOPIC    Imaging Review Dg Chest 2 View  04/03/2014   CLINICAL DATA:  29 year old with cerebral palsy and fever.  EXAM: CHEST  2 VIEW  COMPARISON:  03/18/2013  FINDINGS: Again noted are 2 surgical clips in the left mediastinal region. There is a new oval-shaped opacity in the right upper chest measuring up to 4.4 cm. This opacity is probably in the posterior right upper lobe based on the lateral view. Mild blunting at the right costophrenic angle appears chronic. No large pleural effusions. Heart size is normal. Again noted is a ventricular shunt that extends over left upper chest and stable in position. Trachea is midline.  IMPRESSION: New oval-shaped opacity in the right upper lung. Findings are most compatible with focal pneumonia. Due to the configuration of this opacity, recommend a follow-up examination to ensure resolution.   Electronically Signed   By: Richarda Overlie M.D.   On: 04/03/2014 07:57     EKG Interpretation None      MDM   Final diagnoses:  Fever  CAP (community acquired pneumonia)    Patient presents to the ER for evaluation of fever. He has been sick for 3 days. He has been complaining of back ache, myalgias, fatigue, congestion and mild cough. There was slight diarrhea as well. Examination revealed a healthy-appearing patient without any significant physical findings. Workup for source of fever was initiated. Rapid strep was negative. Chest x-ray does suggest early pneumonia. This is the course of the  patient's symptoms and will be treated. He is not hypoxic, appears well, is appropriate for outpatient management.  Gilda Crease, MD 04/03/14 [phone]

## 2014-04-05 LAB — CULTURE, GROUP A STREP

## 2015-06-22 ENCOUNTER — Encounter (HOSPITAL_BASED_OUTPATIENT_CLINIC_OR_DEPARTMENT_OTHER): Payer: Self-pay | Admitting: *Deleted

## 2015-06-22 ENCOUNTER — Emergency Department (HOSPITAL_BASED_OUTPATIENT_CLINIC_OR_DEPARTMENT_OTHER)
Admission: EM | Admit: 2015-06-22 | Discharge: 2015-06-22 | Disposition: A | Discharge status: Home / Self Care | Payer: Medicaid Other | Attending: Emergency Medicine | Admitting: Emergency Medicine

## 2015-06-22 DIAGNOSIS — Z79899 Other long term (current) drug therapy: Secondary | ICD-10-CM | POA: Insufficient documentation

## 2015-06-22 DIAGNOSIS — Y999 Unspecified external cause status: Secondary | ICD-10-CM | POA: Insufficient documentation

## 2015-06-22 DIAGNOSIS — S0181XA Laceration without foreign body of other part of head, initial encounter: Secondary | ICD-10-CM | POA: Insufficient documentation

## 2015-06-22 DIAGNOSIS — Y939 Activity, unspecified: Secondary | ICD-10-CM | POA: Diagnosis not present

## 2015-06-22 DIAGNOSIS — Y929 Unspecified place or not applicable: Secondary | ICD-10-CM | POA: Diagnosis not present

## 2015-06-22 DIAGNOSIS — IMO0002 Reserved for concepts with insufficient information to code with codable children: Secondary | ICD-10-CM

## 2015-06-22 MED ORDER — BACITRACIN ZINC 500 UNIT/GM EX OINT
TOPICAL_OINTMENT | Freq: Once | CUTANEOUS | Status: AC
  Administered 2015-06-22: 21:00:00 via TOPICAL
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
## 2015-06-22 NOTE — Discharge Instructions (Signed)
Wash the wound daily with soap and water . Signs of infection include redness around the wound drainage from the wound more swelling or more pain. Return if concerned for any reason or see Darious's doctor
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
## 2016-06-02 ENCOUNTER — Emergency Department (HOSPITAL_BASED_OUTPATIENT_CLINIC_OR_DEPARTMENT_OTHER)
Admission: EM | Admit: 2016-06-02 | Discharge: 2016-06-02 | Disposition: A | Discharge status: Home / Self Care | Payer: No Typology Code available for payment source | Attending: Emergency Medicine | Admitting: Emergency Medicine

## 2016-06-02 ENCOUNTER — Emergency Department (HOSPITAL_BASED_OUTPATIENT_CLINIC_OR_DEPARTMENT_OTHER): Payer: No Typology Code available for payment source

## 2016-06-02 ENCOUNTER — Encounter (HOSPITAL_BASED_OUTPATIENT_CLINIC_OR_DEPARTMENT_OTHER): Payer: Self-pay | Admitting: *Deleted

## 2016-06-02 DIAGNOSIS — Y92410 Unspecified street and highway as the place of occurrence of the external cause: Secondary | ICD-10-CM | POA: Insufficient documentation

## 2016-06-02 DIAGNOSIS — R569 Unspecified convulsions: Secondary | ICD-10-CM | POA: Insufficient documentation

## 2016-06-02 DIAGNOSIS — Y939 Activity, unspecified: Secondary | ICD-10-CM | POA: Insufficient documentation

## 2016-06-02 DIAGNOSIS — S0003XA Contusion of scalp, initial encounter: Secondary | ICD-10-CM | POA: Insufficient documentation

## 2016-06-02 DIAGNOSIS — Y999 Unspecified external cause status: Secondary | ICD-10-CM | POA: Insufficient documentation

## 2016-06-02 DIAGNOSIS — M546 Pain in thoracic spine: Secondary | ICD-10-CM | POA: Insufficient documentation

## 2016-06-02 NOTE — Discharge Instructions (Signed)
You can take Tylenol as prescribed over-the-counter. Use ice 3-4 times daily alternating 20 minutes on, 20 minutes off. Please return to the emergency department if you develop any new or worsening symptoms.
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
## 2019-01-20 MED FILL — LMA MAD NASAL MISC: 1 days supply | Qty: 2 | Fill #0

## 2019-01-20 MED FILL — MIDAZOLAM HCL 5 MG/ML VIAL: 5 | 1 days supply | Qty: 2 | Fill #0

## 2023-12-06 ENCOUNTER — Emergency Department (HOSPITAL_BASED_OUTPATIENT_CLINIC_OR_DEPARTMENT_OTHER)
Admission: EM | Admit: 2023-12-06 | Discharge: 2023-12-06 | Discharge status: Against Medical Advice | Attending: Emergency Medicine | Admitting: Emergency Medicine

## 2023-12-06 ENCOUNTER — Other Ambulatory Visit: Payer: Self-pay

## 2023-12-06 DIAGNOSIS — Z5321 Procedure and treatment not carried out due to patient leaving prior to being seen by health care provider: Secondary | ICD-10-CM | POA: Insufficient documentation

## 2023-12-06 DIAGNOSIS — S60511A Abrasion of right hand, initial encounter: Secondary | ICD-10-CM | POA: Insufficient documentation

## 2023-12-06 DIAGNOSIS — S60512A Abrasion of left hand, initial encounter: Secondary | ICD-10-CM | POA: Insufficient documentation

## 2023-12-06 DIAGNOSIS — S0990XA Unspecified injury of head, initial encounter: Secondary | ICD-10-CM | POA: Insufficient documentation

## 2023-12-06 DIAGNOSIS — M549 Dorsalgia, unspecified: Secondary | ICD-10-CM | POA: Insufficient documentation
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
